# Patient Record
Sex: Male | Born: 1980 | Race: White | Hispanic: No | Marital: Married | Smoking: Former smoker
Health system: Southern US, Community
[De-identification: ages and names within clinical notes are randomized; demographics above are authoritative.]

## PROBLEM LIST (undated history)

## (undated) DIAGNOSIS — J45909 Unspecified asthma, uncomplicated: Secondary | ICD-10-CM

## (undated) DIAGNOSIS — I10 Essential (primary) hypertension: Secondary | ICD-10-CM

## (undated) DIAGNOSIS — K219 Gastro-esophageal reflux disease without esophagitis: Secondary | ICD-10-CM

## (undated) HISTORY — PX: KNEE SURGERY: SHX244

## (undated) HISTORY — PX: TENDON REPAIR: SHX5111

---

## 2013-12-24 ENCOUNTER — Emergency Department (HOSPITAL_COMMUNITY): Payer: Medicaid Other

## 2013-12-24 ENCOUNTER — Emergency Department (HOSPITAL_COMMUNITY)
Admission: EM | Admit: 2013-12-24 | Discharge: 2013-12-24 | Disposition: A | Discharge status: Home / Self Care | Payer: Medicaid Other | Attending: Emergency Medicine | Admitting: Emergency Medicine

## 2013-12-24 ENCOUNTER — Encounter (HOSPITAL_COMMUNITY): Payer: Self-pay | Admitting: Emergency Medicine

## 2013-12-24 DIAGNOSIS — J029 Acute pharyngitis, unspecified: Secondary | ICD-10-CM | POA: Insufficient documentation

## 2013-12-24 DIAGNOSIS — Z880 Allergy status to penicillin: Secondary | ICD-10-CM | POA: Diagnosis not present

## 2013-12-24 DIAGNOSIS — I10 Essential (primary) hypertension: Secondary | ICD-10-CM | POA: Insufficient documentation

## 2013-12-24 HISTORY — DX: Essential (primary) hypertension: I10

## 2013-12-24 LAB — RAPID STREP SCREEN (MED CTR MEBANE ONLY): Streptococcus, Group A Screen (Direct): NEGATIVE

## 2013-12-24 MED ORDER — HYDROCOD POLST-CHLORPHEN POLST 10-8 MG/5ML PO LQCR: 5.0000 mL | Freq: Two times a day (BID) | ORAL | Status: DC | PRN

## 2013-12-24 MED ORDER — HYDROCOD POLST-CHLORPHEN POLST 10-8 MG/5ML PO LQCR
5.0000 mL | Freq: Once | ORAL | Status: AC
  Administered 2013-12-24: 5 mL via ORAL
  Filled 2013-12-24: qty 5

## 2013-12-24 MED ORDER — IBUPROFEN 800 MG PO TABS
800.0000 mg | ORAL_TABLET | Freq: Once | ORAL | Status: AC
  Administered 2013-12-24: 800 mg via ORAL
  Filled 2013-12-24: qty 1

## 2013-12-24 MED ORDER — DEXAMETHASONE 4 MG PO TABS
10.0000 mg | ORAL_TABLET | Freq: Once | ORAL | Status: AC
  Administered 2013-12-24: 10 mg via ORAL
  Filled 2013-12-24: qty 1

## 2013-12-24 MED ORDER — DEXAMETHASONE 6 MG PO TABS
10.0000 mg | ORAL_TABLET | Freq: Once | ORAL | Status: DC
  Filled 2013-12-24: qty 1

## 2013-12-24 NOTE — Discharge Instructions (Signed)
Pharyngitis °Pharyngitis is redness, pain, and swelling (inflammation) of your pharynx.  °CAUSES  °Pharyngitis is usually caused by infection. Most of the time, these infections are from viruses (viral) and are part of a cold. However, sometimes pharyngitis is caused by bacteria (bacterial). Pharyngitis can also be caused by allergies. Viral pharyngitis may be spread from person to person by coughing, sneezing, and personal items or utensils (cups, forks, spoons, toothbrushes). Bacterial pharyngitis may be spread from person to person by more intimate contact, such as kissing.  °SIGNS AND SYMPTOMS  °Symptoms of pharyngitis include:   °· Sore throat.   °· Tiredness (fatigue).   °· Low-grade fever.   °· Headache. °· Joint pain and muscle aches. °· Skin rashes. °· Swollen lymph nodes. °· Plaque-like film on throat or tonsils (often seen with bacterial pharyngitis). °DIAGNOSIS  °Your health care provider will ask you questions about your illness and your symptoms. Your medical history, along with a physical exam, is often all that is needed to diagnose pharyngitis. Sometimes, a rapid strep test is done. Other lab tests may also be done, depending on the suspected cause.  °TREATMENT  °Viral pharyngitis will usually get better in 3-4 days without the use of medicine. Bacterial pharyngitis is treated with medicines that kill germs (antibiotics).  °HOME CARE INSTRUCTIONS  °· Drink enough water and fluids to keep your urine clear or pale yellow.   °· Only take over-the-counter or prescription medicines as directed by your health care provider:   °¨ If you are prescribed antibiotics, make sure you finish them even if you start to feel better.   °¨ Do not take aspirin.   °· Get lots of rest.   °· Gargle with 8 oz of salt water (½ tsp of salt per 1 qt of water) as often as every 1-2 hours to soothe your throat.   °· Throat lozenges (if you are not at risk for choking) or sprays may be used to soothe your throat. °SEEK MEDICAL  CARE IF:  °· You have large, tender lumps in your neck. °· You have a rash. °· You cough up green, yellow-brown, or bloody spit. °SEEK IMMEDIATE MEDICAL CARE IF:  °· Your neck becomes stiff. °· You drool or are unable to swallow liquids. °· You vomit or are unable to keep medicines or liquids down. °· You have severe pain that does not go away with the use of recommended medicines. °· You have trouble breathing (not caused by a stuffy nose). °MAKE SURE YOU:  °· Understand these instructions. °· Will watch your condition. °· Will get help right away if you are not doing well or get worse. °Document Released: 02/25/2005 Document Revised: 12/16/2012 Document Reviewed: 11/02/2012 °ExitCare® Patient Information ©2015 [employer]. This information is not intended to replace advice given to you by your health care provider. Make sure you discuss any questions you have with your health care provider. ° ° °Emergency Department Resource Guide °1) Find a Doctor and Pay Out of Pocket °Although you won't have to find out who is covered by your insurance plan, it is a good idea to ask around and get recommendations. You will then need to call the office and see if the doctor you have chosen will accept you as a new patient and what types of options they offer for patients who are self-pay. Some doctors offer discounts or will set up payment plans for their patients who do not have insurance, but you will need to ask so you aren't surprised when you get to your appointment. ° °  2) Contact Your Local Health Department °Not all health departments have doctors that can see patients for sick visits, but many do, so it is worth a call to see if yours does. If you don't know where your local health department is, you can check in your phone book. The CDC also has a tool to help you locate your state's health department, and many state websites also have listings of all of their local health departments. ° °3) Find a Walk-in Clinic °If  your illness is not likely to be very severe or complicated, you may want to try a walk in clinic. These are popping up all over the country in pharmacies, drugstores, and shopping centers. They're usually staffed by nurse practitioners or physician assistants that have been trained to treat common illnesses and complaints. They're usually fairly quick and inexpensive. However, if you have serious medical issues or chronic medical problems, these are probably not your best option. ° °No Primary Care Doctor: °- Call [facility] at  832-8000 - they can help you locate a primary care doctor that  accepts your insurance, provides certain services, etc. °- Physician Referral Service- 1-800-533-3463 ° °Chronic Pain Problems: °Organization         Address  Phone   Notes  ° [facility]  (336) 297-2271 Patients need to be referred by their primary care doctor.  ° °Medication Assistance: °Organization         Address  Phone   Notes  °[place] [facility] [address] °[place], [place] [address] (336) 641-8030 --Must be a resident of [place] °-- Must have NO insurance coverage whatsoever (no Medicaid/ Medicare, etc.) °-- The pt. MUST have a primary care doctor that directs their care regularly and follows them in the community °  °[facility]  (866) 331-1348   °[employer]  (888) 892-1162   ° °Agencies that provide inexpensive medical care: °Organization         Address  Phone   Notes  °[facility]  (336) 832-8035   °[facility]    (336) 832-7272   °[facility] [address] °[place], [place] [address] (336) 832-4777   °[facility] [address], °[place] (336) 271-4999   °[facility]    (336) 373-0678   °[facility]    (336) 272-1050   °[facility] ° [address], [place] Phone:  (336) 832-4444, Fax:  (336) 832-4440 Hours of  Operation:  9 am - 6 pm, M-F.  Also accepts Medicaid/Medicare and self-pay.  °[facility] ° [address], [place] Phone: (336) 832-3150, Fax: (336) 832-3151. Hours of Operation:  8:30 am - 5:30 pm, M-F.  Also accepts Medicaid and self-pay.  °[facility] [place] [address], [place] Phone: (336) 878-6027   °[facility] [address], [place], [place] (336)723-1848, Ext. 123 Mondays & Thursdays: 7-9 AM.  First 15 patients are seen on a first come, first serve basis. °  ° °Medicaid-accepting [place] Providers: ° °Organization         Address  Phone   Notes  °[facility] [address], [place] (336) 641-2100 Also accepts self-pay patients.  °[facility] [address],  ° (336) 856-9996   °[facility] [address], [place] (336) 288-8857   °[facility] [address] [place] [address], [place] (336) 299-7000   °Veita Bland [address], [place]  ° (336) 373-1557 Only accepts [place] Access Medicaid patients after they have their name applied to their card.  ° °Self-Pay (no insurance) in [place]: ° °Organization         Address  Phone   Notes  °Sickle Cell Patients, [facility] [address], [place] (336) 832-1970   °[facility] [address], [place] (336) 832-4400   °[facility] ° [address], [place] (336) 992-4800   °[facility]/Dr. Osei-Bonsu ° [address] [place] [address], [place] or [address], [place] (336) 841-8500 Phone number for both [place] and [place] locations is the same.  °[facility] [address], [place] (336) 299-0000   °[facility] [address] [place] [address], [place] or [address] (336) 852-7530 °(336) 878-2260   °[facility] [address], [place] (336) 350-1642, phone; (336) 294-5005, fax Sees patients 1st and 3rd Saturday of every month.  Must not qualify for public or private insurance (i.e. Medicaid, Medicare, [place] Health Choice, Veterans' Benefits) • Household income should be no more than 200% of the poverty level •The clinic cannot treat you if you are pregnant or think you are pregnant • Sexually transmitted diseases are not treated at the clinic.  ° ° °Dental Care: °Organization         Address  Phone  Notes  °[facility] [address], [place] (336) 641-6152 Accepts children up to age 21 who are enrolled in Medicaid or [place] Health Choice; pregnant women with a Medicaid card; and children who have applied for Medicaid or [place] Health Choice, but were declined, whose parents can pay a reduced fee at time of service.  °[facility] [place]  [address], [place] (336) 641-7733 Accepts children up to age 21 who are enrolled in Medicaid or [place] Health Choice; pregnant women with a Medicaid card; and children who have applied for Medicaid or [place] Health Choice, but were declined, whose parents can pay a reduced fee at time of service.  °[facility] ° [address], [place] (336) 641-4533 Patients are seen by appointment only. Walk-ins are not accepted. [facility] will see patients 18 years of age and older. °Monday - Tuesday (8am-5pm) °Most Wednesdays (8:30-5pm) °$30 per visit, cash only  °[facility] ° [address], [place] (336) 641-4533 Patients are seen by appointment only. Walk-ins are not accepted. [facility] will see patients 18 years of age and older. °One Wednesday Evening (Monthly: Volunteer Based).  $30 per visit, cash only  °[facility]  (919) 537-3737 for adults; Children under age 4, call Graduate Pediatric  Dentistry at (919) [phone]. Children aged 4-14, please call (919) [phone] to request a pediatric application. ° Dental services are provided in all areas of dental care including fillings, crowns and bridges, complete and partial dentures, implants, gum treatment, root canals, and extractions. Preventive care is also provided. Treatment is provided to both adults and children. °Patients are selected via a lottery and there is often a waiting list. °  °[facility] [address], °  [place] ° (336) 763-8833 [email or web address] °  °[facility] [address], [place], [place] (336)723-1848, Ext. 123 Second and Fourth Thursday of each month, opens at 6:30 AM; Clinic ends at 9 AM.  Patients are seen on a first-come first-served basis, and a limited number are seen during each clinic.  ° °[facility] ° [address], [place], [place] (336) 723-7904   Eligibility Requirements °You must have lived in [place], [place], or [place] counties for at least the last three months. °  You cannot be eligible for state or federal sponsored healthcare insurance, including Veterans Administration, Medicaid, or Medicare. °  You generally cannot be eligible for healthcare insurance through your employer.  °  How to apply: °Eligibility screenings are held every Tuesday and Wednesday afternoon from 1:00 pm until 4:00 pm. You do not need an appointment for the interview!  °[facility] [address], [place], [place] 336-631-2330   °[facility]  336-342-8273   °[facility]  336-703-3100   °[facility]  336-570-6415   ° °Behavioral Health Resources in the Community: °Intensive Outpatient Programs °Organization         Address  Phone  Notes  °[facility] [address], [place], [place] 336-878-6098   °[facility] [address], [place], [place] 336-832-9800   °[facility] [address], [place], [place] ° 336-882-2125   °[facility] [address],  °[place], [place] 1-800-853-5163 or 336-641-4981   °Substance Abuse Resources °Organization         Address  Phone  Notes  °[facility]  336-882-2125   °[facility]  336-784-9470   °[facility]  336-285-9073   °[facility]  336-845-3988   °Residential & Outpatient Substance Abuse Program  1-800-659-3381   °Psychological Services °Organization         Address  Phone  Notes  °[facility]  336- 832-9600   °[facility]  336- 378-7881   °[facility] [address], [place] 1-800-853-5163 or 336-641-4981   ° °Mobile Crisis Teams °Organization         Address  Phone  Notes  °[facility], Mobile Crisis Care Unit  1-877-626-1772   °Assertive °[facility] ° [address] [place], [place] 336-834-9664   °Sharon DeEsch [address] °[place] 336-554-5454   ° °Self-Help/Support Groups °Organization         Address  Phone             Notes  °[facility] - variety of support groups  336- 373-1402 Call for more information  °Narcotics Anonymous (NA), [facility] [address], °[place]  2 meetings at this location  ° °Residential Treatment Programs °Organization         Address  Phone  Notes  °[facility] [address],    °[place]  1-866-801-8205   °[facility] ° [address], [place], [place] 704-293-8524   °[facility] [address], [place] 336-845-3988 Admissions: 8am-3pm M-F  °[facility] [address]    °[place], [place] 336-841-1104   °[facility] [address], [place],  336-379-7146   °[facility] [address]  °[place],  336-285-9073   °[facility] - Intensive Outpatient [address],  [place], [place] 336-852-3033   °[facility] [address]  °[place], [place] 1-877-615-2722 or 336-784-9470   °[facility] [address] [place], [place] 336-227-7417 Accepts Medicaid  °[facility] [address]  °[place] 1-800-659-3381 Substance Abuse/Addiction Treatment  ° °[place] Behavioral Health Resources °Organization         Address  Phone  Notes  °[facility]  (888) 581-9988   °Julie Brannon, PhD [address] [place], [place]   (336) 349-5553 or (336) 951-0000   °[facility]   [address] °[place], [place] (336) 349-4454   °[facility] [address], [place], [place] (336) 342-8316 Insurance/Medicaid/sponsorship through [facility]  °[facility] [address]                                    [place], [place] (336) 342-8316 Therapy/tele-psych/case  °[facility] [address]  ° [place], [place] (336) 349-2233    °Dr. Arfeen  (336) 349-4544   °[facility] [place]  [employer] [place] [facility] 1) [address], [place] °2) [address], [place] °3)  [address], [place] (336) 349-3220 °(336) 342-7768 ° °(336) 342-8140   °[place] [facility] (336) 342-1394 or (336) 342-3537 (After Hours)    ° ° ° ° °

## 2013-12-24 NOTE — ED Notes (Signed)
Gwendolyn GrantWalden MD at bedside.

## 2013-12-24 NOTE — ED Provider Notes (Signed)
CSN: [id number]     Arrival date & time 12/24/13  [id number] History   First MD Initiated Contact with Patient 12/24/13 [id number]     Chief Complaint  Patient presents with  . Cough  . Sore Throat     (Consider location/radiation/quality/duration/timing/severity/associated sxs/prior Treatment) Patient is a 33 y.o. male presenting with cough and pharyngitis. The history is provided by the patient.  Cough Cough characteristics:  Productive Sputum characteristics:  Yellow Severity:  Moderate Onset quality:  Gradual Duration:  1 week Timing:  Constant Progression:  Unchanged Chronicity:  New Smoker: no   Context: sick contacts   Relieved by:  Nothing Worsened by:  Nothing tried Associated symptoms: chills, myalgias, rhinorrhea, shortness of breath and sore throat   Associated symptoms: no chest pain and no fever   Sore Throat Associated symptoms include shortness of breath. Pertinent negatives include no chest pain.    Past Medical History  Diagnosis Date  . Hypertension    History reviewed. No pertinent past surgical history. No family history on file. History  Substance Use Topics  . Smoking status: Former Smoker    Quit date: 03/27/1999  . Smokeless tobacco: Not on file  . Alcohol Use: No    Review of Systems  Constitutional: Positive for chills. Negative for fever.  HENT: Positive for rhinorrhea and sore throat.   Respiratory: Positive for cough and shortness of breath.   Cardiovascular: Negative for chest pain and leg swelling.  Musculoskeletal: Positive for myalgias.      Allergies  Penicillins  Home Medications   Prior to Admission medications   Not on File   BP 101/79  Pulse 92  Temp(Src) 98.2 F (36.8 C) (Oral)  Resp 20  SpO2 99% Physical Exam  Constitutional: He is oriented to person, place, and time. He appears well-developed and well-nourished. No distress.  HENT:  Head: Normocephalic and atraumatic.  Mouth/Throat: Posterior oropharyngeal  erythema (bilateral tonsils) present. No oropharyngeal exudate, posterior oropharyngeal edema or tonsillar abscesses.  Eyes: EOM are normal. Pupils are equal, round, and reactive to light.  Neck: Normal range of motion. Neck supple.  Cardiovascular: Normal rate and regular rhythm.  Exam reveals no friction rub.   No murmur heard. Pulmonary/Chest: Effort normal and breath sounds normal. No respiratory distress. He has no wheezes. He has no rales.  Abdominal: He exhibits no distension. There is no tenderness. There is no rebound.  Musculoskeletal: Normal range of motion. He exhibits no edema.  Neurological: He is alert and oriented to person, place, and time.  Skin: He is not diaphoretic.    ED Course  Procedures (including critical care time) Labs Review Labs Reviewed - No data to display  Imaging Review Dg Chest 2 View  12/24/2013   CLINICAL DATA:  Fever, cough, congestion, and shortness of breath for 1 week.  EXAM: CHEST  2 VIEW  COMPARISON:  None.  FINDINGS: The cardiomediastinal silhouette is within normal limits. The lungs are well inflated and clear. There is no evidence of pleural effusion or pneumothorax. No acute osseous abnormality is identified.  IMPRESSION: No active cardiopulmonary disease.   Electronically Signed   By: Sebastian AcheAllen  Grady   On: 12/24/2013 08:06     EKG Interpretation None      MDM   Final diagnoses:  Pharyngitis    80M with URI symptoms. Sore throat, subjective fever/chills, cough, runny nose. Mild productivity to his cough. No SOB. No N/V. No abdominal pain. On exam, posterior pharyngeal erythema without swelling/uvular shift. Will  give decadron for symptomatic treatment. Will xray and swab for strep. Xray negative. Strep negative.  Patient stable for discharge, given cough medicine.  I have reviewed all labs and imaging and considered them in my medical decision making.   Elwin MochaBlair Van Seymore, MD 12/24/13 [phone]

## 2013-12-24 NOTE — ED Notes (Addendum)
Per pt reports productive cough and runny nose for a week. Pt report sore throat with cough and swallow. Pt reports "I know how I got it. I was in a junk yard in a rain storm." Pt denies SOB.

## 2013-12-26 LAB — CULTURE, GROUP A STREP

## 2015-07-19 ENCOUNTER — Emergency Department: Payer: Medicaid Other

## 2015-07-19 ENCOUNTER — Emergency Department
Admission: EM | Admit: 2015-07-19 | Discharge: 2015-07-19 | Disposition: A | Discharge status: Home / Self Care | Payer: Medicaid Other | Attending: Emergency Medicine | Admitting: Emergency Medicine

## 2015-07-19 ENCOUNTER — Encounter: Payer: Self-pay | Admitting: Emergency Medicine

## 2015-07-19 DIAGNOSIS — S60552A Superficial foreign body of left hand, initial encounter: Secondary | ICD-10-CM

## 2015-07-19 DIAGNOSIS — I10 Essential (primary) hypertension: Secondary | ICD-10-CM | POA: Insufficient documentation

## 2015-07-19 DIAGNOSIS — Z87891 Personal history of nicotine dependence: Secondary | ICD-10-CM | POA: Insufficient documentation

## 2015-07-19 DIAGNOSIS — Y999 Unspecified external cause status: Secondary | ICD-10-CM | POA: Diagnosis not present

## 2015-07-19 DIAGNOSIS — Y9389 Activity, other specified: Secondary | ICD-10-CM | POA: Insufficient documentation

## 2015-07-19 DIAGNOSIS — W208XXA Other cause of strike by thrown, projected or falling object, initial encounter: Secondary | ICD-10-CM | POA: Diagnosis not present

## 2015-07-19 DIAGNOSIS — Y9289 Other specified places as the place of occurrence of the external cause: Secondary | ICD-10-CM | POA: Diagnosis not present

## 2015-07-19 DIAGNOSIS — S51822A Laceration with foreign body of left forearm, initial encounter: Secondary | ICD-10-CM | POA: Insufficient documentation

## 2015-07-19 DIAGNOSIS — S51812A Laceration without foreign body of left forearm, initial encounter: Secondary | ICD-10-CM

## 2015-07-19 MED ORDER — LIDOCAINE-EPINEPHRINE (PF) 1 %-1:200000 IJ SOLN
10.0000 mL | Freq: Once | INTRAMUSCULAR | Status: AC
  Administered 2015-07-19: 10 mL
  Filled 2015-07-19: qty 30

## 2015-07-19 NOTE — ED Notes (Signed)
Pt reports that he was taking down a shed and something fell on him causing a laceration/puncture wound to his left arm - pt is also c/o pain left hand/wrist especially with movement of fingers and wrist

## 2015-07-19 NOTE — ED Provider Notes (Signed)
[facility] Emergency Department Provider Note  ____________________________________________  Time seen: Approximately 9:20 PM  I have reviewed the triage vital signs and the nursing notes.   HISTORY  Chief Complaint Laceration    HPI Clifford Mccarthy is a 35 y.o. male presenting with a laceration on the lateral aspect of the left forearm. Patient states he was working in a shed at approximately 2000 this evening when an object fell striking his arm. He is unaware what the object was. He admits to pain with movement of the wrist or fingers and numbness in his thumb. He denies numbness or tingling to any of the other digits. He feels as though there may be a foreign body in the laceration. Patient states he is unaware of his most recent tetanus vaccination.    Past Medical History  Diagnosis Date  . Hypertension     There are no active problems to display for this patient.   History reviewed. No pertinent past surgical history.  Current Outpatient Rx  Name  Route  Sig  Dispense  Refill  . acetaminophen (TYLENOL) 325 MG tablet   Oral   Take 650 mg by mouth every 4 (four) hours as needed for moderate pain.         . chlorpheniramine-HYDROcodone (TUSSIONEX) 10-8 MG/5ML LQCR   Oral   Take 5 mLs by mouth every 12 (twelve) hours as needed for cough.   115 mL   0     Allergies Penicillins  No family history on file.  Social History Social History  Substance Use Topics  . Smoking status: Former Smoker    Quit date: 03/27/1999  . Smokeless tobacco: None  . Alcohol Use: No     Review of Systems  Eyes: No visual changes.  Musculoskeletal: Negative for musculoskeletal pain. Pain  Skin: Negative for rash, abrasions, lacerations, ecchymosis. Neurological: Negative for headaches, focal weakness or numbness. 10-point ROS otherwise negative.  ____________________________________________   PHYSICAL EXAM:  VITAL SIGNS: ED Triage Vitals  Enc  Vitals Group     BP 07/19/15 1951 120/73 mmHg     Pulse Rate 07/19/15 1951 73     Resp 07/19/15 1951 20     Temp 07/19/15 1951 97.9 F (36.6 C)     Temp Source 07/19/15 1951 Oral     SpO2 07/19/15 1951 97 %     Weight 07/19/15 1951 230 lb (104.327 kg)     Height 07/19/15 1951 5\' 9"  (1.753 m)     Head Cir --      Peak Flow --      Pain Score 07/19/15 1951 10     Pain Loc --      Pain Edu? --      Excl. in GC? --      Constitutional: Alert and oriented. Well appearing and in no acute distress. Eyes: Conjunctivae are normal. PERRL. EOMI. Head: Atraumatic. Neck: No stridor.  Cardiovascular:  Good peripheral circulation. Respiratory: Normal respiratory effort without tachypnea or retractions. Musculoskeletal: Full range of motion to all extremities. No gross deformities appreciated. Full ROM in the left wrist with pain some pain. Tenderness to palpation over left wrist.  Neurologic:  Normal speech and language. No gross focal neurologic deficits are appreciated.  Skin:  Skin is warm, dry and intact. No rash noted. Laceration of the left forearm.  It appears fibrous/metallic in nature. No bleeding at this time. Edges are smooth in nature. Pulses and sensation are intact distally. Psychiatric: Mood and  affect are normal. Speech and behavior are normal. Patient exhibits appropriate insight and judgement.   ____________________________________________   LABS (all labs ordered are listed, but only abnormal results are displayed)  Labs Reviewed - No data to display   RADIOLOGY I, Delorise Royals Tashanda Fuhrer, personally viewed and evaluated these images (plain radiographs) as part of my medical decision making, as well as reviewing the written report by the radiologist   DG Hand Complete Left (Final result) Result time: 07/19/15 21:55:03   Final result by Rad Results In Interface (07/19/15 21:55:03)   Narrative:   CLINICAL DATA: 35 year old male with trauma to the left hand. Evaluate  for foreign object.  EXAM: LEFT HAND - COMPLETE 3+ VIEW  COMPARISON: Radiograph dated 07/19/2015  FINDINGS: There is a 5.5 cm linear foreign object in the soft tissues of the dorsum of the wrist extending from the distal radius to the proximal aspect of the third metacarpal similar to prior study. This is concerning for a foreign object. Clinical correlation is recommended. There is mild soft tissue swelling of the dorsal aspect of the distal forearm and wrist. There is no acute fracture or dislocation. The bones are well mineralized. No arthropathy.  IMPRESSION: Linear radiopaque structure in the dorsal aspect of the wrist as described similar to prior study may represent a foreign object. Clinical correlation is recommended.   Electronically Signed By: Elgie Collard M.D. On: 07/19/2015 21:55          DG Hand Complete Left (Final result) Result time: 07/19/15 20:56:06   Final result by Rad Results In Interface (07/19/15 20:56:06)   Narrative:   CLINICAL DATA: reports taking down shed and was hit with unknown object; approx 1" lac noted to hand with no active bleeding. Pain across metacarpals. Initially counter  EXAM: LEFT HAND - COMPLETE 3+ VIEW  COMPARISON: None.  FINDINGS: No evidence of fracture dislocation.  There is a linear 4 cm foreign body projecting over the radial aspect of the carpal roe. This may represent a foreign body within the soft tissues. This object is only minimally radiodense along the edges.  IMPRESSION: Potential long narrow foreign body with within the soft tissues dorsal carpal row       PROCEDURES  Procedure(s) performed:   LACERATION REPAIR Performed by: Racheal Patches Authorized by: Delorise Royals Kristl Morioka Consent: Verbal consent obtained. Risks and benefits: risks, benefits and alternatives were discussed Consent given by: patient Patient identity confirmed: provided demographic data Prepped and  Draped in normal sterile fashion Wound explored  Laceration Location: left forearm.   Laceration Length: 2cm  Foreign body is appreciated visually in the wound. This is extracted. This appears to be a fibrous bristle. It measures approximately 2.5 cm in length.  Anesthesia: local infiltration  Local anesthetic: lidocaine 1% w/ epinephrine  Anesthetic total: 7 ml  Irrigation method: syringe Amount of cleaning: standard  Skin closure: 4-0 Ethilon sutures   Number of sutures: 4   Technique: Simple interrupted   Patient tolerance: Patient tolerated the procedure well with no immediate complications.    Medications  lidocaine-EPINEPHrine (XYLOCAINE-EPINEPHrine) 1 %-1:200000 (PF) injection 10 mL (10 mLs Infiltration Given 07/19/15 2247)     ____________________________________________   INITIAL IMPRESSION / ASSESSMENT AND PLAN / ED COURSE  Pertinent labs & imaging results that were available during my care of the patient were reviewed by me and considered in my medical decision making (see chart for details).  Patient's diagnosis is consistent with Laceration to left forearm. There is a  foreign body visualized on x-ray. No acute osseous abnormality. Examination of wound reveals foreign body that is removed. However, there is a visualize foreign body is distal to the incision site. This is nonpalpable on exam. Due to the nature of Underlying vasculature as well as tendons and ligaments in this region and without being able to appreciate foreign body by palpation, I will not attempt to extract this. As such, laceration is closed as described above. Patient's hand is splinted to prevent movement and he will be referred to orthopedics for removal of visualize foreign body..  Patient is given ED precautions to return to the ED for any worsening or new symptoms.     ____________________________________________  FINAL CLINICAL IMPRESSION(S) / ED DIAGNOSES  Final diagnoses:   Forearm laceration, left, initial encounter  Foreign body in hand, left, initial encounter      NEW MEDICATIONS STARTED DURING THIS VISIT:  Discharge Medication List as of 07/19/2015 10:02 PM          This chart was dictated using voice recognition software/Dragon. Despite best efforts to proofread, errors can occur which can change the meaning. Any change was purely unintentional.   Racheal Patches, PA-C 07/20/15 2322  Myrna Blazer, MD 07/22/15 [phone]

## 2015-07-19 NOTE — Discharge Instructions (Signed)

## 2015-07-19 NOTE — ED Notes (Signed)
Pt to triage via w/c with no distress noted; reports taking down shed PTA and was hit with unknown object; approx 1" lac noted to left FA with no active bleeding; 4x4 gauze applied to site

## 2015-07-26 ENCOUNTER — Encounter: Payer: Self-pay | Admitting: *Deleted

## 2015-07-26 ENCOUNTER — Other Ambulatory Visit: Payer: Medicaid Other

## 2015-07-26 NOTE — Pre-Procedure Instructions (Signed)
PT WAS ASKED IF HE COULD COME IN TODAY FOR EKG DUE TO HTN AND HE SAID NO.  I TOLD HIM WE WILL DO IT THE AM OF SURGERY BUT IF ANYTHING IS ABNORMAL THEN HIS SURGERY COULD BE CANCELLED. I TOLD HIM THAT IS WHY WE LIKE TO DO IT BEFORE HIS SURGERY.  PT VERBALIZED UNDERSTANDING BUT KEPT SAYING THAT HIS BP IS FINE

## 2015-07-26 NOTE — Patient Instructions (Signed)
  Your procedure is scheduled on: 07-27-15 Report to medical mall same day surgery 2nd floor To find out your arrival time please call [phone] between 1PM - 3PM on 07-26-15  Remember: Instructions that are not followed completely may result in serious medical risk, up to and including death, or upon the discretion of your surgeon and anesthesiologist your surgery may need to be rescheduled.    _X___ 1. Do not eat food or drink liquids after midnight. No gum chewing or hard candies.     _X___ 2. No Alcohol for 24 hours before or after surgery.   ____ 3. Bring all medications with you on the day of surgery if instructed.    ____ 4. Notify your doctor if there is any change in your medical condition     (cold, fever, infections).     Do not wear jewelry, make-up, hairpins, clips or nail polish.  Do not wear lotions, powders, or perfumes. You may wear deodorant.  Do not shave 48 hours prior to surgery. Men may shave face and neck.  Do not bring valuables to the hospital.    [facility] is not responsible for any belongings or valuables.               Contacts, dentures or bridgework may not be worn into surgery.  Leave your suitcase in the car. After surgery it may be brought to your room.  For patients admitted to the hospital, discharge time is determined by your treatment team.   Patients discharged the day of surgery will not be allowed to drive home.   Please read over the following fact sheets that you were given:      ____ Take these medicines the morning of surgery with A SIP OF WATER:    1.NONE  2.   3.   4.  5.  6.  ____ Fleet Enema (as directed)   _X___ Use CHG Soap as directed  ____ Use inhalers on the day of surgery  ____ Stop metformin 2 days prior to surgery    ____ Take 1/2 of usual insulin dose the night before surgery and none on the morning of surgery.   ____ Stop Coumadin/Plavix/aspirin-N/A  _X___ Stop Anti-inflammatories-STOP IBUPROFEN NOW-NO  NSAIDS OR ASA PRODUCTS-TYLENOL/TRAMADOL OK TO TAKE   ____ Stop supplements until after surgery.    ____ Bring C-Pap to the hospital.

## 2015-07-27 ENCOUNTER — Encounter
Admission: RE | Disposition: A | Discharge status: Home / Self Care | Payer: Self-pay | Source: Ambulatory Visit | Attending: Surgery

## 2015-07-27 ENCOUNTER — Ambulatory Visit: Payer: Medicaid Other | Admitting: Anesthesiology

## 2015-07-27 ENCOUNTER — Encounter: Payer: Self-pay | Admitting: *Deleted

## 2015-07-27 ENCOUNTER — Ambulatory Visit
Admission: RE | Admit: 2015-07-27 | Discharge: 2015-07-27 | Disposition: A | Discharge status: Home / Self Care | Payer: Medicaid Other | Source: Ambulatory Visit | Attending: Surgery | Admitting: Surgery

## 2015-07-27 DIAGNOSIS — S61542A Puncture wound with foreign body of left wrist, initial encounter: Secondary | ICD-10-CM | POA: Insufficient documentation

## 2015-07-27 DIAGNOSIS — Z833 Family history of diabetes mellitus: Secondary | ICD-10-CM | POA: Insufficient documentation

## 2015-07-27 DIAGNOSIS — J45909 Unspecified asthma, uncomplicated: Secondary | ICD-10-CM | POA: Diagnosis not present

## 2015-07-27 DIAGNOSIS — Z809 Family history of malignant neoplasm, unspecified: Secondary | ICD-10-CM | POA: Insufficient documentation

## 2015-07-27 DIAGNOSIS — I10 Essential (primary) hypertension: Secondary | ICD-10-CM | POA: Diagnosis not present

## 2015-07-27 DIAGNOSIS — Z8249 Family history of ischemic heart disease and other diseases of the circulatory system: Secondary | ICD-10-CM | POA: Diagnosis not present

## 2015-07-27 DIAGNOSIS — Z820 Family history of epilepsy and other diseases of the nervous system: Secondary | ICD-10-CM | POA: Diagnosis not present

## 2015-07-27 DIAGNOSIS — Y9289 Other specified places as the place of occurrence of the external cause: Secondary | ICD-10-CM | POA: Insufficient documentation

## 2015-07-27 DIAGNOSIS — I4510 Unspecified right bundle-branch block: Secondary | ICD-10-CM | POA: Insufficient documentation

## 2015-07-27 DIAGNOSIS — Y9389 Activity, other specified: Secondary | ICD-10-CM | POA: Insufficient documentation

## 2015-07-27 DIAGNOSIS — W208XXA Other cause of strike by thrown, projected or falling object, initial encounter: Secondary | ICD-10-CM | POA: Diagnosis not present

## 2015-07-27 DIAGNOSIS — Z79899 Other long term (current) drug therapy: Secondary | ICD-10-CM | POA: Diagnosis not present

## 2015-07-27 DIAGNOSIS — Z7951 Long term (current) use of inhaled steroids: Secondary | ICD-10-CM | POA: Diagnosis not present

## 2015-07-27 HISTORY — DX: Unspecified asthma, uncomplicated: J45.909

## 2015-07-27 HISTORY — PX: FOREIGN BODY REMOVAL: SHX962

## 2015-07-27 HISTORY — DX: Gastro-esophageal reflux disease without esophagitis: K21.9

## 2015-07-27 SURGERY — REMOVAL FOREIGN BODY EXTREMITY
Anesthesia: General | Site: Hand | Laterality: Left | Wound class: Clean

## 2015-07-27 MED ORDER — CEFAZOLIN SODIUM 1 G IJ SOLR
INTRAMUSCULAR | Status: DC | PRN
  Administered 2015-07-27: 1975 mg via INTRAMUSCULAR
  Administered 2015-07-27: 25 mg via INTRAMUSCULAR

## 2015-07-27 MED ORDER — FENTANYL CITRATE (PF) 100 MCG/2ML IJ SOLN
INTRAMUSCULAR | Status: AC
  Filled 2015-07-27: qty 2

## 2015-07-27 MED ORDER — LIDOCAINE HCL (PF) 1 % IJ SOLN
INTRAMUSCULAR | Status: AC
  Filled 2015-07-27: qty 30

## 2015-07-27 MED ORDER — ONDANSETRON HCL 4 MG/2ML IJ SOLN
INTRAMUSCULAR | Status: AC
  Filled 2015-07-27: qty 2

## 2015-07-27 MED ORDER — ONDANSETRON HCL 4 MG PO TABS: 4.0000 mg | ORAL_TABLET | Freq: Four times a day (QID) | ORAL | Status: DC | PRN

## 2015-07-27 MED ORDER — METOCLOPRAMIDE HCL 10 MG PO TABS: 5.0000 mg | ORAL_TABLET | Freq: Three times a day (TID) | ORAL | Status: DC | PRN

## 2015-07-27 MED ORDER — FAMOTIDINE 20 MG PO TABS
ORAL_TABLET | ORAL | Status: AC
  Administered 2015-07-27: 20 mg via ORAL
  Filled 2015-07-27: qty 1

## 2015-07-27 MED ORDER — ROCURONIUM BROMIDE 100 MG/10ML IV SOLN
INTRAVENOUS | Status: DC | PRN
  Administered 2015-07-27: 20 mg via INTRAVENOUS

## 2015-07-27 MED ORDER — METOCLOPRAMIDE HCL 5 MG/ML IJ SOLN: 5.0000 mg | Freq: Three times a day (TID) | INTRAMUSCULAR | Status: DC | PRN

## 2015-07-27 MED ORDER — SULFAMETHOXAZOLE-TRIMETHOPRIM 400-80 MG PO TABS: 1.0000 | ORAL_TABLET | Freq: Two times a day (BID) | ORAL | Status: AC

## 2015-07-27 MED ORDER — FENTANYL CITRATE (PF) 100 MCG/2ML IJ SOLN
INTRAMUSCULAR | Status: DC | PRN
  Administered 2015-07-27: 200 ug via INTRAVENOUS
  Administered 2015-07-27: 100 ug via INTRAVENOUS

## 2015-07-27 MED ORDER — BUPIVACAINE HCL (PF) 0.5 % IJ SOLN
INTRAMUSCULAR | Status: AC
  Filled 2015-07-27: qty 30

## 2015-07-27 MED ORDER — FENTANYL CITRATE (PF) 100 MCG/2ML IJ SOLN
25.0000 ug | INTRAMUSCULAR | Status: DC | PRN
Start: 2015-07-27 — End: 2015-07-27
  Administered 2015-07-27 (×4): 25 ug via INTRAVENOUS

## 2015-07-27 MED ORDER — ONDANSETRON HCL 4 MG/2ML IJ SOLN
4.0000 mg | Freq: Four times a day (QID) | INTRAMUSCULAR | Status: DC | PRN
  Administered 2015-07-27: 4 mg via INTRAVENOUS

## 2015-07-27 MED ORDER — OXYCODONE HCL 5 MG PO TABS
ORAL_TABLET | ORAL | Status: AC
  Filled 2015-07-27: qty 1

## 2015-07-27 MED ORDER — SUCCINYLCHOLINE CHLORIDE 20 MG/ML IJ SOLN
INTRAMUSCULAR | Status: DC | PRN
  Administered 2015-07-27: 100 mg via INTRAVENOUS

## 2015-07-27 MED ORDER — GLYCOPYRROLATE 0.2 MG/ML IJ SOLN
INTRAMUSCULAR | Status: DC | PRN
  Administered 2015-07-27: 0.4 mg via INTRAVENOUS

## 2015-07-27 MED ORDER — LIDOCAINE HCL (CARDIAC) 20 MG/ML IV SOLN
INTRAVENOUS | Status: DC | PRN
  Administered 2015-07-27 (×2): 100 mg via INTRAVENOUS

## 2015-07-27 MED ORDER — BUPIVACAINE HCL (PF) 0.5 % IJ SOLN
INTRAMUSCULAR | Status: DC | PRN
  Administered 2015-07-27: 10 mL

## 2015-07-27 MED ORDER — KETOROLAC TROMETHAMINE 30 MG/ML IJ SOLN
INTRAMUSCULAR | Status: DC | PRN
  Administered 2015-07-27: 30 mg via INTRAVENOUS

## 2015-07-27 MED ORDER — NEOMYCIN-POLYMYXIN B GU 40-200000 IR SOLN
Status: DC | PRN
  Administered 2015-07-27: 2 mL

## 2015-07-27 MED ORDER — NEOMYCIN-POLYMYXIN B GU 40-200000 IR SOLN
Status: AC
  Filled 2015-07-27: qty 2

## 2015-07-27 MED ORDER — POTASSIUM CHLORIDE IN NACL 20-0.9 MEQ/L-% IV SOLN
INTRAVENOUS | Status: DC
  Filled 2015-07-27 (×5): qty 1000

## 2015-07-27 MED ORDER — ONDANSETRON HCL 4 MG/2ML IJ SOLN
4.0000 mg | Freq: Once | INTRAMUSCULAR | Status: AC | PRN
  Administered 2015-07-27: 4 mg via INTRAVENOUS

## 2015-07-27 MED ORDER — FAMOTIDINE 20 MG PO TABS
20.0000 mg | ORAL_TABLET | Freq: Once | ORAL | Status: AC
  Administered 2015-07-27: 20 mg via ORAL

## 2015-07-27 MED ORDER — ONDANSETRON HCL 4 MG/2ML IJ SOLN
INTRAMUSCULAR | Status: AC
  Administered 2015-07-27: 4 mg via INTRAVENOUS
  Filled 2015-07-27: qty 2

## 2015-07-27 MED ORDER — LACTATED RINGERS IV SOLN
INTRAVENOUS | Status: DC
  Administered 2015-07-27: 13:00:00 via INTRAVENOUS

## 2015-07-27 MED ORDER — NEOSTIGMINE METHYLSULFATE 10 MG/10ML IV SOLN
INTRAVENOUS | Status: DC | PRN
  Administered 2015-07-27: 3 mg via INTRAVENOUS

## 2015-07-27 MED ORDER — ONDANSETRON HCL 4 MG/2ML IJ SOLN
INTRAMUSCULAR | Status: DC | PRN
  Administered 2015-07-27: 4 mg via INTRAVENOUS

## 2015-07-27 MED ORDER — MIDAZOLAM HCL 2 MG/2ML IJ SOLN
INTRAMUSCULAR | Status: DC | PRN
  Administered 2015-07-27: 1.5 mg via INTRAVENOUS

## 2015-07-27 MED ORDER — OXYCODONE HCL 5 MG PO TABS
5.0000 mg | ORAL_TABLET | ORAL | Status: DC | PRN
  Administered 2015-07-27: 5 mg via ORAL

## 2015-07-27 SURGICAL SUPPLY — 37 items
BANDAGE ACE 3X5.8 VEL STRL LF (GAUZE/BANDAGES/DRESSINGS) ×3 IMPLANT
BENZOIN TINCTURE PRP APPL 2/3 (GAUZE/BANDAGES/DRESSINGS) ×3 IMPLANT
BNDG ESMARK 4X12 TAN STRL LF (GAUZE/BANDAGES/DRESSINGS) ×3 IMPLANT
CANISTER SUCT 1200ML W/VALVE (MISCELLANEOUS) ×3 IMPLANT
CHLORAPREP W/TINT 26ML (MISCELLANEOUS) ×3 IMPLANT
CLOSURE WOUND 1/4X4 (GAUZE/BANDAGES/DRESSINGS) ×1
CORD BIP STRL DISP 12FT (MISCELLANEOUS) ×3 IMPLANT
CUFF TOURN 18 STER (MISCELLANEOUS) IMPLANT
DRAPE SURG 17X11 SM STRL (DRAPES) ×6 IMPLANT
FORCEPS JEWEL BIP 4-3/4 STR (INSTRUMENTS) ×3 IMPLANT
GAUZE PETRO XEROFOAM 1X8 (MISCELLANEOUS) IMPLANT
GAUZE SPONGE 4X4 12PLY STRL (GAUZE/BANDAGES/DRESSINGS) IMPLANT
GLOVE BIO SURGEON STRL SZ8 (GLOVE) ×6 IMPLANT
GLOVE EXAM NITRILE PF MED BLUE (GLOVE) ×3 IMPLANT
GLOVE INDICATOR 8.0 STRL GRN (GLOVE) ×3 IMPLANT
GOWN STRL REUS W/ TWL LRG LVL3 (GOWN DISPOSABLE) ×1 IMPLANT
GOWN STRL REUS W/ TWL XL LVL3 (GOWN DISPOSABLE) ×1 IMPLANT
GOWN STRL REUS W/TWL LRG LVL3 (GOWN DISPOSABLE) ×2
GOWN STRL REUS W/TWL XL LVL3 (GOWN DISPOSABLE) ×2
KIT RM TURNOVER STRD PROC AR (KITS) ×3 IMPLANT
NDL SAFETY 25GX1.5 (NEEDLE) ×3 IMPLANT
NS IRRIG 500ML POUR BTL (IV SOLUTION) ×3 IMPLANT
PACK EXTREMITY ARMC (MISCELLANEOUS) ×3 IMPLANT
PAD CAST CTTN 4X4 STRL (SOFTGOODS) IMPLANT
PADDING CAST 3IN STRL (MISCELLANEOUS) ×2
PADDING CAST BLEND 3X4 STRL (MISCELLANEOUS) ×1 IMPLANT
PADDING CAST COTTON 4X4 STRL (SOFTGOODS)
SPLINT CAST 1 STEP 3X12 (MISCELLANEOUS) ×3 IMPLANT
SPLINT WRIST LG LT TX990309 (SOFTGOODS) IMPLANT
SPLINT WRIST M LT TX990308 (SOFTGOODS) IMPLANT
SPLINT WRIST M RT TX990303 (SOFTGOODS) IMPLANT
STOCKINETTE STRL 4IN 9604848 (GAUZE/BANDAGES/DRESSINGS) IMPLANT
STRIP CLOSURE SKIN 1/4X4 (GAUZE/BANDAGES/DRESSINGS) ×2 IMPLANT
SUT PROLENE 4 0 PS 2 18 (SUTURE) ×3 IMPLANT
SUT VIC AB 2-0 SH 27 (SUTURE) ×2
SUT VIC AB 2-0 SH 27XBRD (SUTURE) ×1 IMPLANT
SWABSTK COMLB BENZOIN TINCTURE (MISCELLANEOUS) ×3 IMPLANT

## 2015-07-27 NOTE — Transfer of Care (Signed)
Immediate Anesthesia Transfer of Care Note  Patient: Clifford Mccarthy  Procedure(s) Performed: Procedure(s): REMOVAL FOREIGN BODY EXTREMITY (Left)  Patient Location: PACU  Anesthesia Type:General  Level of Consciousness: awake, alert , oriented and patient cooperative  Airway & Oxygen Therapy: Patient Spontanous Breathing and Patient connected to face mask oxygen  Post-op Assessment: Report given to RN and Post -op Vital signs reviewed and stable  Post vital signs: Reviewed and stable  Last Vitals:  Filed Vitals:   07/27/15 1226  BP: 144/97  Pulse: 83  Temp: 37.1 C  Resp: 16    Last Pain:  Filed Vitals:   07/27/15 1227  PainSc: 6          Complications: No apparent anesthesia complications

## 2015-07-27 NOTE — Discharge Instructions (Addendum)
Keep dressing/splint dry and intact. Keep hand elevated above heart level. May shower after dressing/splint removed on postop day 4 (Monday). Cover sutures with Band-Aids after drying off. May reapply plaster or Velcro splint after showering as needed for comfort. Apply ice to affected area frequently. Return for follow-up in 10-14 days or as scheduled.  AMBULATORY SURGERY  DISCHARGE INSTRUCTIONS   1) The drugs that you were given will stay in your system until tomorrow so for the next 24 hours you should not:  A) Drive an automobile B) Make any legal decisions C) Drink any alcoholic beverage   2) You may resume regular meals tomorrow.  Today it is better to start with liquids and gradually work up to solid foods.  You may eat anything you prefer, but it is better to start with liquids, then soup and crackers, and gradually work up to solid foods.   3) Please notify your doctor immediately if you have any unusual bleeding, trouble breathing, redness and pain at the surgery site, drainage, fever, or pain not relieved by medication.    4) Additional Instructions:        Please contact your physician with any problems or Same Day Surgery at [phone], Monday through Friday 6 am to 4 pm, or [place] at [facility] Main number at [phone].

## 2015-07-27 NOTE — Op Note (Signed)
07/27/2015  4:04 PM  Patient:   Clifford Mccarthy  Pre-Op Diagnosis:   Status post acute foreign body, left wrist.  Post-Op Diagnosis:   Same.  Procedure:   Exploration, debridement, and removal of foreign body, left wrist.  Surgeon:   Maryagnes AmosJ. Jeffrey Poggi, MD  Assistant:   None  Anesthesia:   GET  Findings:   As above.  Complications:   None  EBL:   5 cc  Fluids:   800 cc crystalloid  TT:   67 minutes at 250 mmHg  Drains:   None  Closure:   4-0 Prolene interrupted sutures, 2-0 Vicryl subcuticular sutures.  Brief Clinical Note:   The patient is a 35 year old male who sustained an injury to his left wrist 1 week ago. Apparently he was in his garage when something fell down from a shelf above and stabbed his wrist. He came to the emergency room where a portion of it was removed and the wound closed. He was started on po antibiotics and advised to follow-up with orthopedics as there was still evidence of a retained foreign body in the dorsal aspect of the left wrist. He presents at this time for formal irrigation and debridement with exploration and removal of the foreign body.  Procedure:   The patient was brought into the operating room and lain in the supine position. After adequate general endotracheal intubation and anesthesia were obtained, the patient's left upper extremity and hand were prepped with ChloraPrep solution before being draped sterilely. Preoperative antibiotics were administered. A timeout was performed to verify the appropriate surgical site before the limb was exsanguinated with an Esmarch and the tourniquet inflated to 250 mmHg. The location of the foreign body was identified using the FluoroScan unit and an approximately 4 cm incision made over the length of the foreign body. The incision was carried down through the subcutaneous tissues with care taken to protect overlying neurovascular structures, as well as the tendons. The foreign body was in line with the entrance  site which was in the radial aspect of the radial metaphyseal region. The foreign body was identified and noted to be deep to the second and third dorsal compartment tendons. The extensor indices proprius also was identified and protected. The foreign body was followed distally and noted to engage the third [facility] joint. The foreign body was a tubular structure approximately 7-8 mm in diameter and made of a black composite type material that splintered quite easily. All of the major fragments were removed as well as most of the small splintered pieces. This required meticulous dissection.   After thorough debridement, the wound was copiously irrigated with bacitracin saline solution using bulb irrigation. The retinaculum was reapproximated using 2-0 Vicryl interrupted sutures before the skin was closed using 2-0 Vicryl subcuticular sutures. Benzoin and Steri-Strips were applied to the skin. The sutures that had been placed into the entrance site along the radial metaphysis by the ER physician were removed. The skin margins were debrided sharply and the wound reapproximated using 4-0 Prolene interrupted sutures. A total of 10 cc of 0.5% plain Sensorcaine was injected in and around the 2 incision sites before a sterile bulky dressing was applied to the wound. The placed was placed into a volar wrist splint maintaining the wrist in neutral position before the patient was awakened, extubated, and returned to the recovery room in satisfactory condition after tolerating the procedure well.

## 2015-07-27 NOTE — Progress Notes (Signed)
Unable to clip arm for surgery due to pain in arm.

## 2015-07-27 NOTE — Anesthesia Preprocedure Evaluation (Addendum)
Anesthesia Evaluation  Patient identified by MRN, date of birth, ID band Patient awake    Reviewed: Allergy & Precautions, NPO status , Patient's Chart, lab work & pertinent test results  Airway Mallampati: II  TM Distance: >3 FB     Dental  (+) Chipped   Pulmonary former smoker,    Pulmonary exam normal        Cardiovascular hypertension, Pt. on medications Normal cardiovascular exam     Neuro/Psych negative neurological ROS  negative psych ROS   GI/Hepatic Neg liver ROS, GERD  Medicated and Controlled,  Endo/Other  negative endocrine ROS  Renal/GU negative Renal ROS  negative genitourinary   Musculoskeletal Foreign body in hand   Abdominal Normal abdominal exam  (+)   Peds negative pediatric ROS (+)  Hematology negative hematology ROS (+)   Anesthesia Other Findings   Reproductive/Obstetrics                            Anesthesia Physical Anesthesia Plan  ASA: II  Anesthesia Plan: General   Post-op Pain Management:    Induction: Intravenous and Rapid sequence  Airway Management Planned: Oral ETT  Additional Equipment:   Intra-op Plan:   Post-operative Plan: Extubation in OR  Informed Consent: I have reviewed the patients History and Physical, chart, labs and discussed the procedure including the risks, benefits and alternatives for the proposed anesthesia with the patient or authorized representative who has indicated his/her understanding and acceptance.     Plan Discussed with: CRNA and Surgeon  Anesthesia Plan Comments: (Patient agrees with light GOT with use of local to minimize the general effects.)       Anesthesia Quick Evaluation

## 2015-07-27 NOTE — H&P (Signed)
Paper H&P to be scanned into permanent record. H&P reviewed. No changes. 

## 2015-07-27 NOTE — Progress Notes (Signed)
Cardiopulmonary here for EKG

## 2015-07-27 NOTE — Anesthesia Postprocedure Evaluation (Signed)
Anesthesia Post Note  Patient: Clifford Mccarthy  Procedure(s) Performed: Procedure(s) (LRB): REMOVAL FOREIGN BODY EXTREMITY (Left)  Patient location during evaluation: PACU Anesthesia Type: General Level of consciousness: awake Pain management: pain level controlled Vital Signs Assessment: post-procedure vital signs reviewed and stable Respiratory status: spontaneous breathing Cardiovascular status: blood pressure returned to baseline Anesthetic complications: no    Last Vitals:  Filed Vitals:   07/27/15 1605 07/27/15 1620  BP: 143/98 129/97  Pulse: 102 72  Temp: 36.8 C   Resp: 18 18    Last Pain:  Filed Vitals:   07/27/15 1622  PainSc: 0-No pain                 VAN STAVEREN,[name]

## 2015-07-27 NOTE — Anesthesia Procedure Notes (Signed)
Procedure Name: Intubation Date/Time: 07/27/2015 2:27 PM Performed by: Shirlee LimerickMARION, Erich Kochan Pre-anesthesia Checklist: Patient identified, Emergency Drugs available, Suction available and Patient being monitored Patient Re-evaluated:Patient Re-evaluated prior to inductionOxygen Delivery Method: Circle system utilized Preoxygenation: Pre-oxygenation with 100% oxygen Intubation Type: IV induction Laryngoscope Size: Mac and 3 Grade View: Grade I Tube type: Oral Tube size: 7.0 mm Number of attempts: 1 Placement Confirmation: ETT inserted through vocal cords under direct vision,  positive ETCO2 and breath sounds checked- equal and bilateral Secured at: 23 cm Tube secured with: Tape Dental Injury: Teeth and Oropharynx as per pre-operative assessment

## 2015-07-28 ENCOUNTER — Encounter: Payer: Self-pay | Admitting: Surgery

## 2018-01-24 IMAGING — CR DG HAND COMPLETE 3+V*L*
1 series · 3 of 3 positions shown · non-contrast
Comparison: None.

CLINICAL DATA: reports taking down shed and was hit with unknown
object; approx 1" lac noted to hand with no active bleeding. Pain
across metacarpals. Initially counter

EXAM:
LEFT HAND - COMPLETE 3+ VIEW

[Series 1: x hand pa left · 0.14mm/px · 3 of 3 slices shown]
[im 1/3]
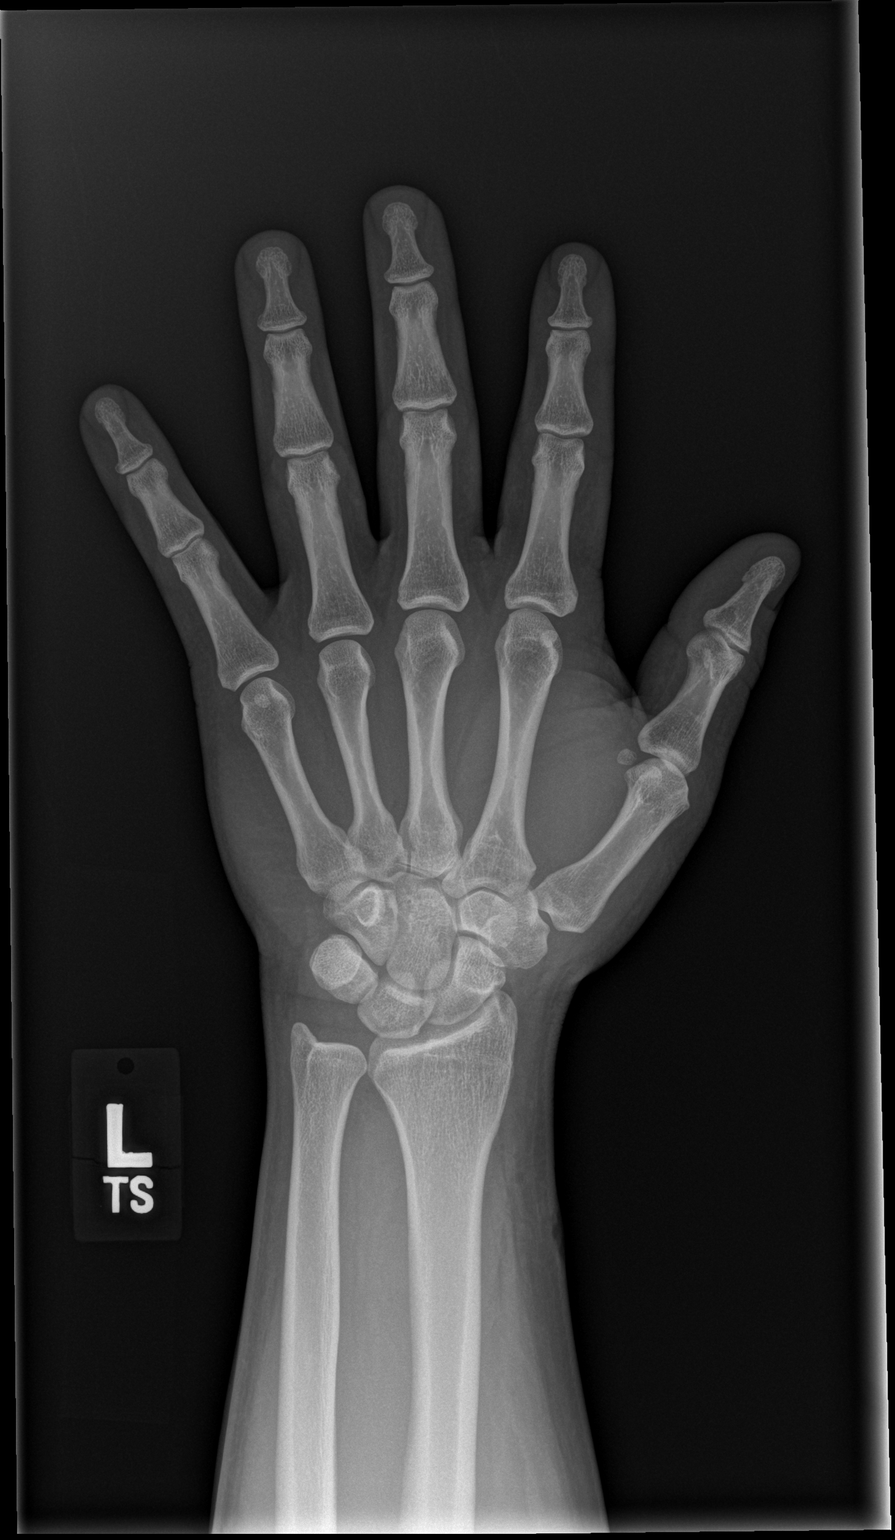
[im 2/3]
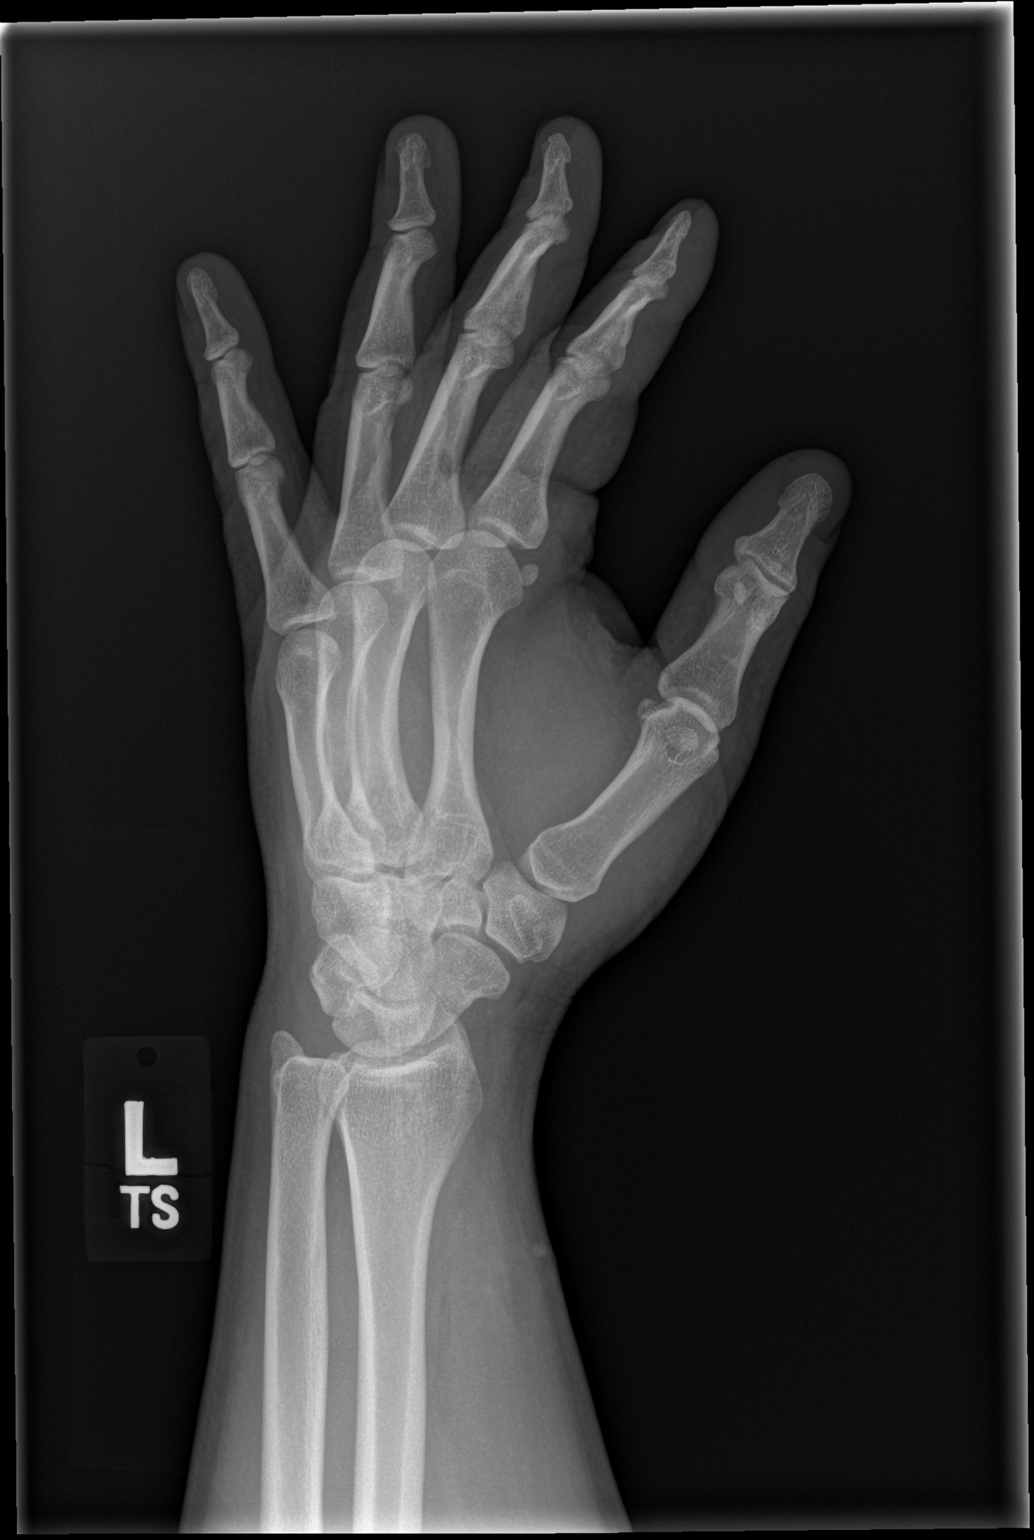
[im 3/3]
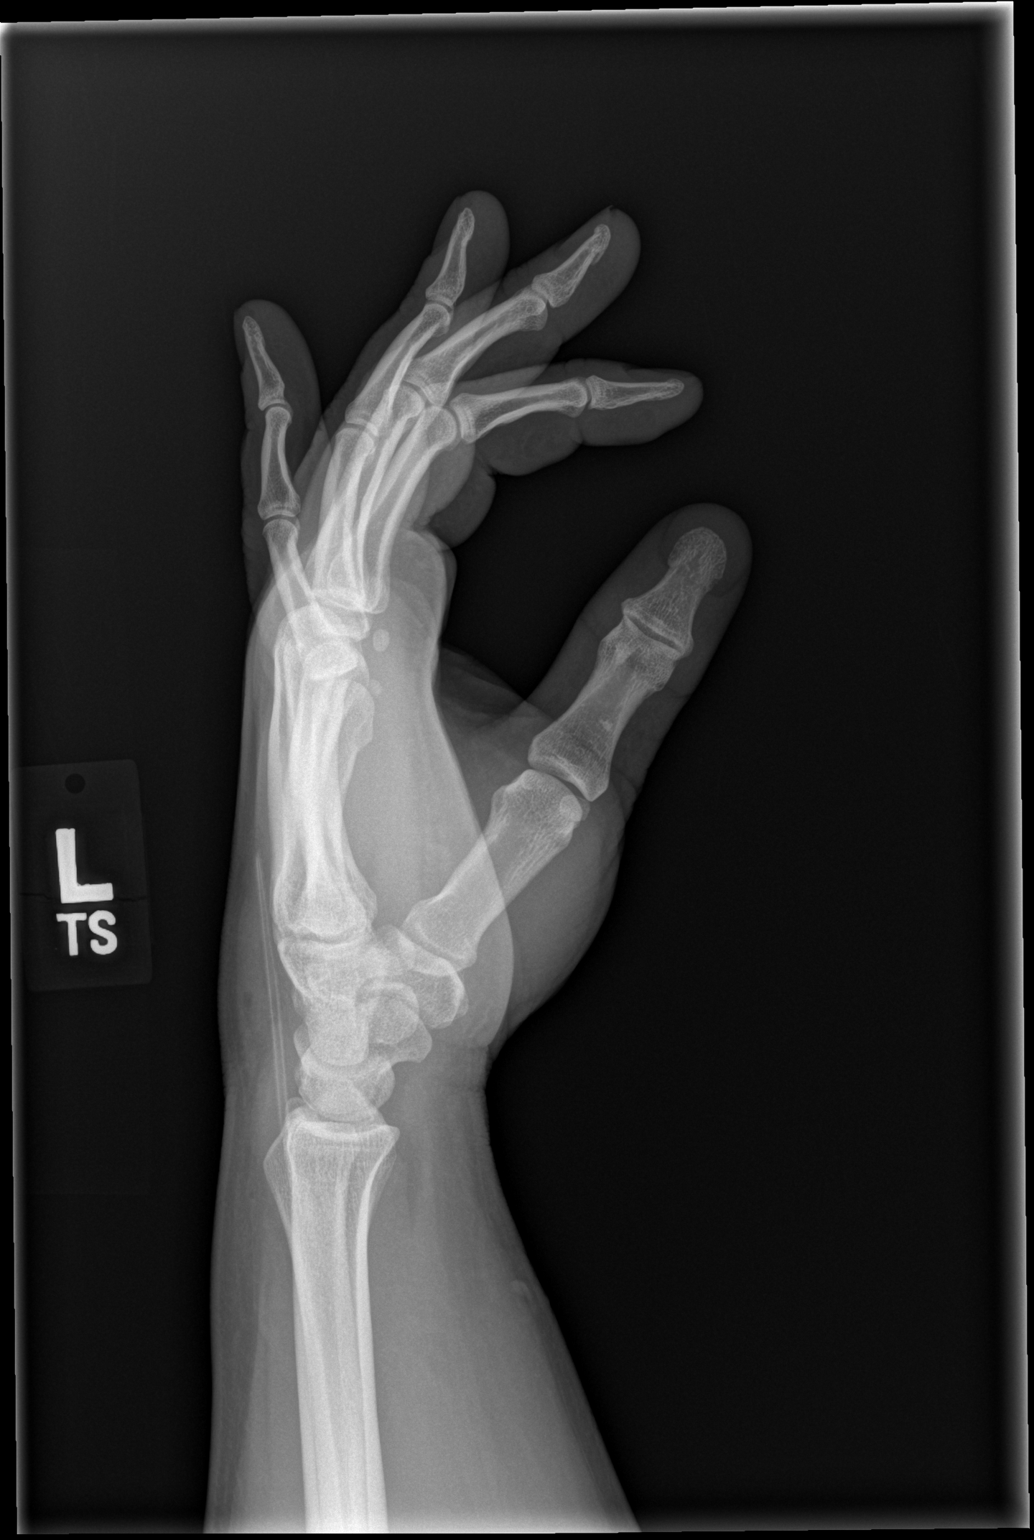

[3 of 3 positions shown; findings below may reference images not displayed]

FINDINGS: No evidence of fracture dislocation.

There is a linear 4 cm foreign body projecting over the radial
aspect of the [REDACTED]. This may represent a foreign body within
the soft tissues. This object is only minimally radiodense along the
edges.
IMPRESSION: Potential long narrow foreign body with within the soft tissues
dorsal carpal row

## 2018-01-24 IMAGING — DX DG HAND COMPLETE 3+V*L*
3 series · 3 of 3 positions shown · non-contrast
Comparison: Radiograph dated 07/19/2015

CLINICAL DATA: 35-year-old male with trauma to the left hand.
Evaluate for foreign object.

EXAM:
LEFT HAND - COMPLETE 3+ VIEW

[hand ap]
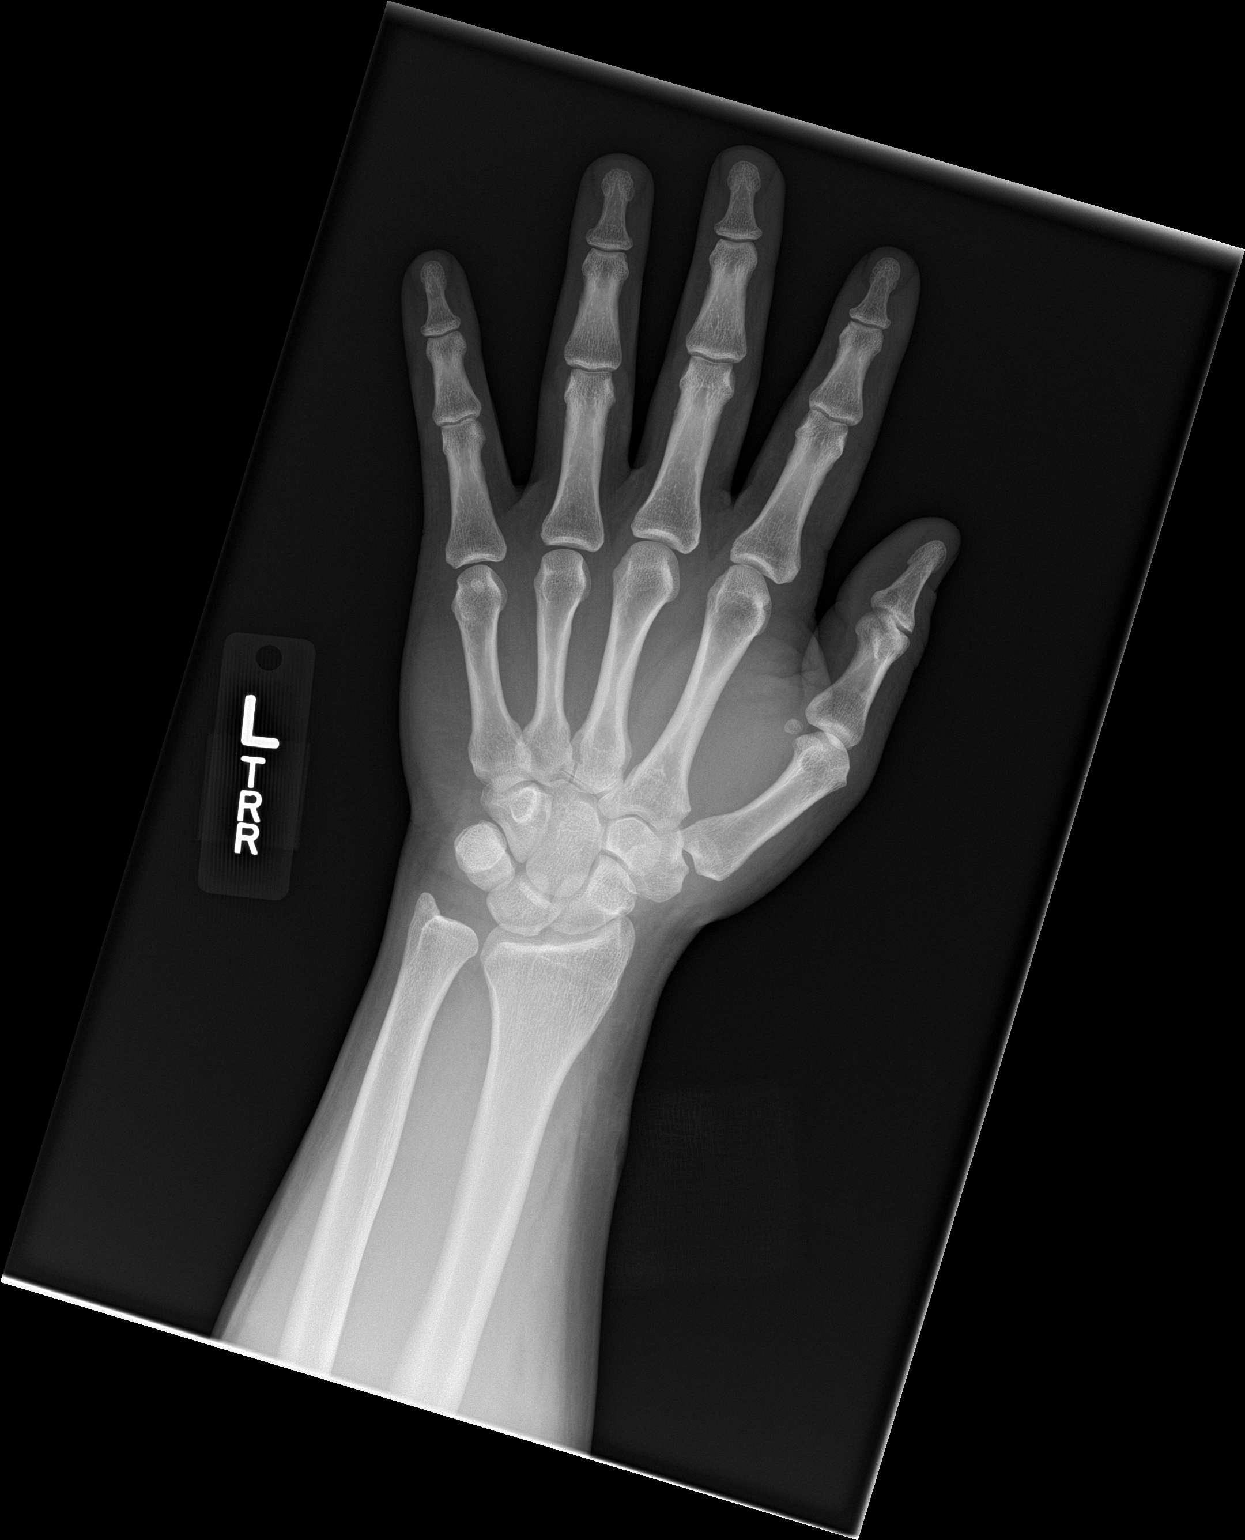

[hand obl]
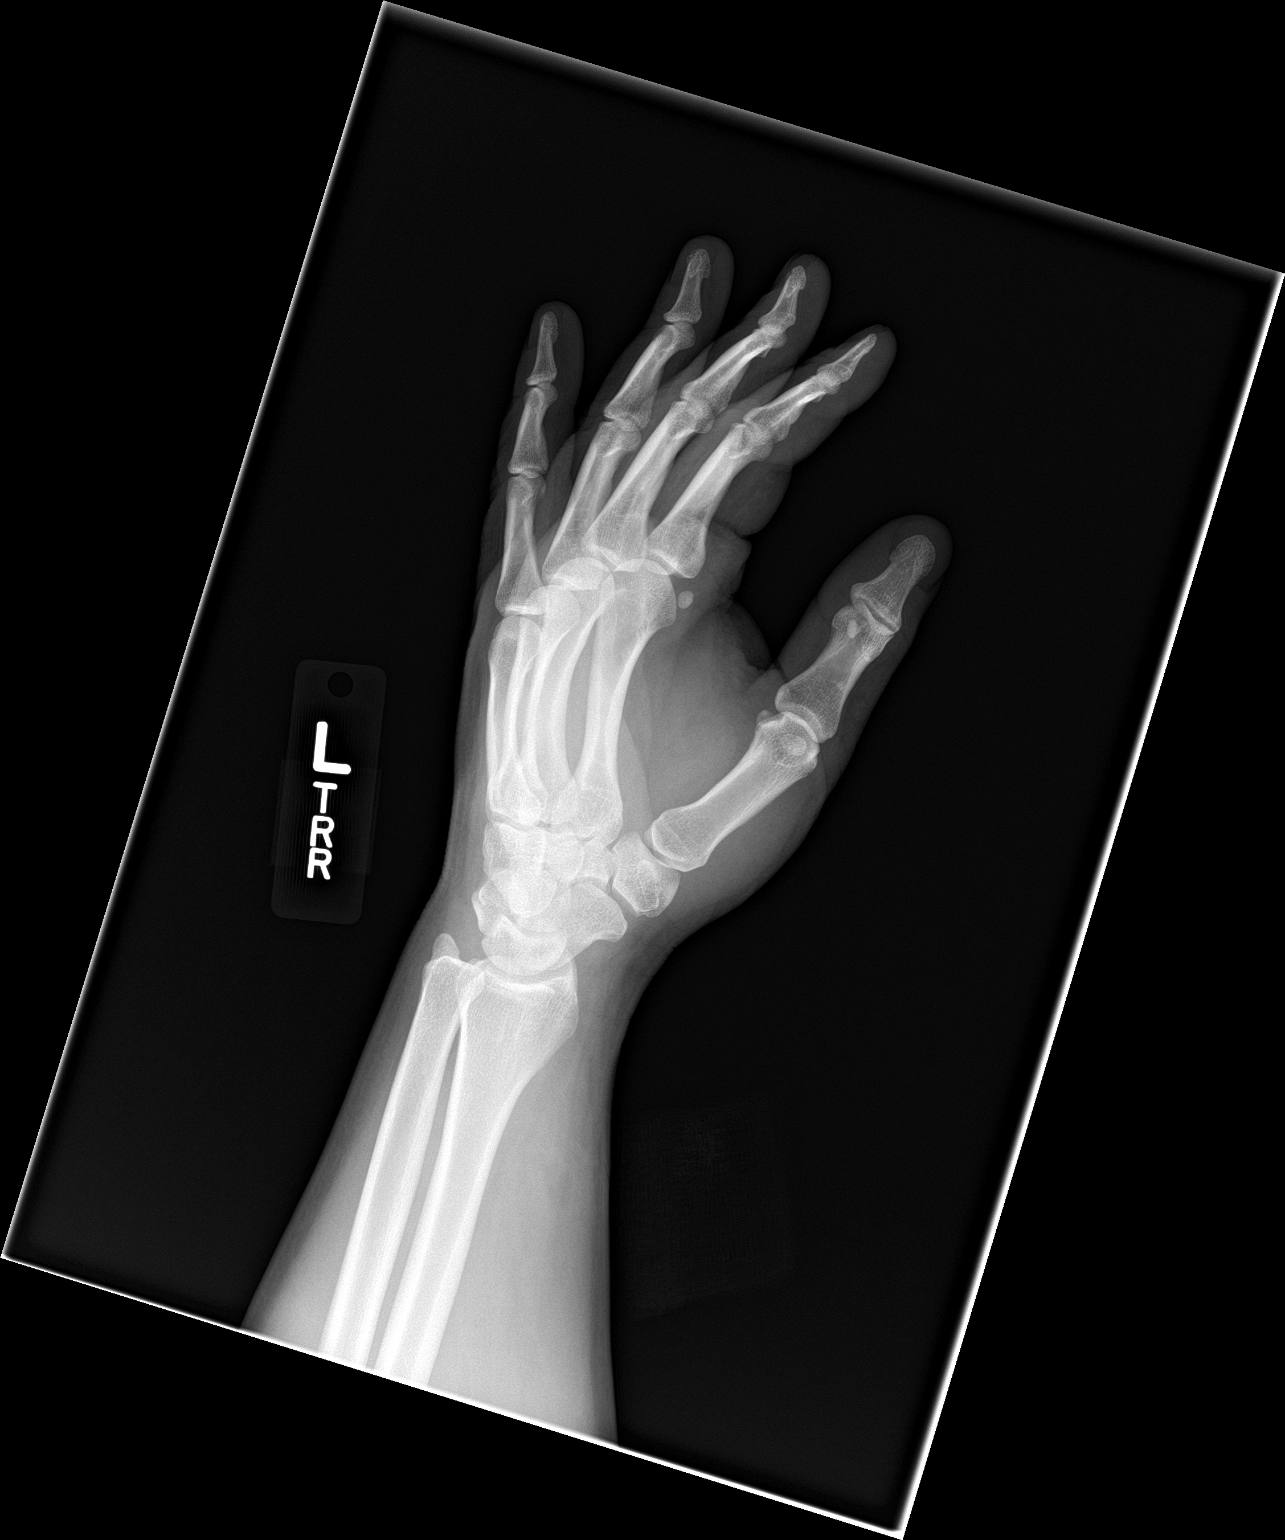

[hand lat]
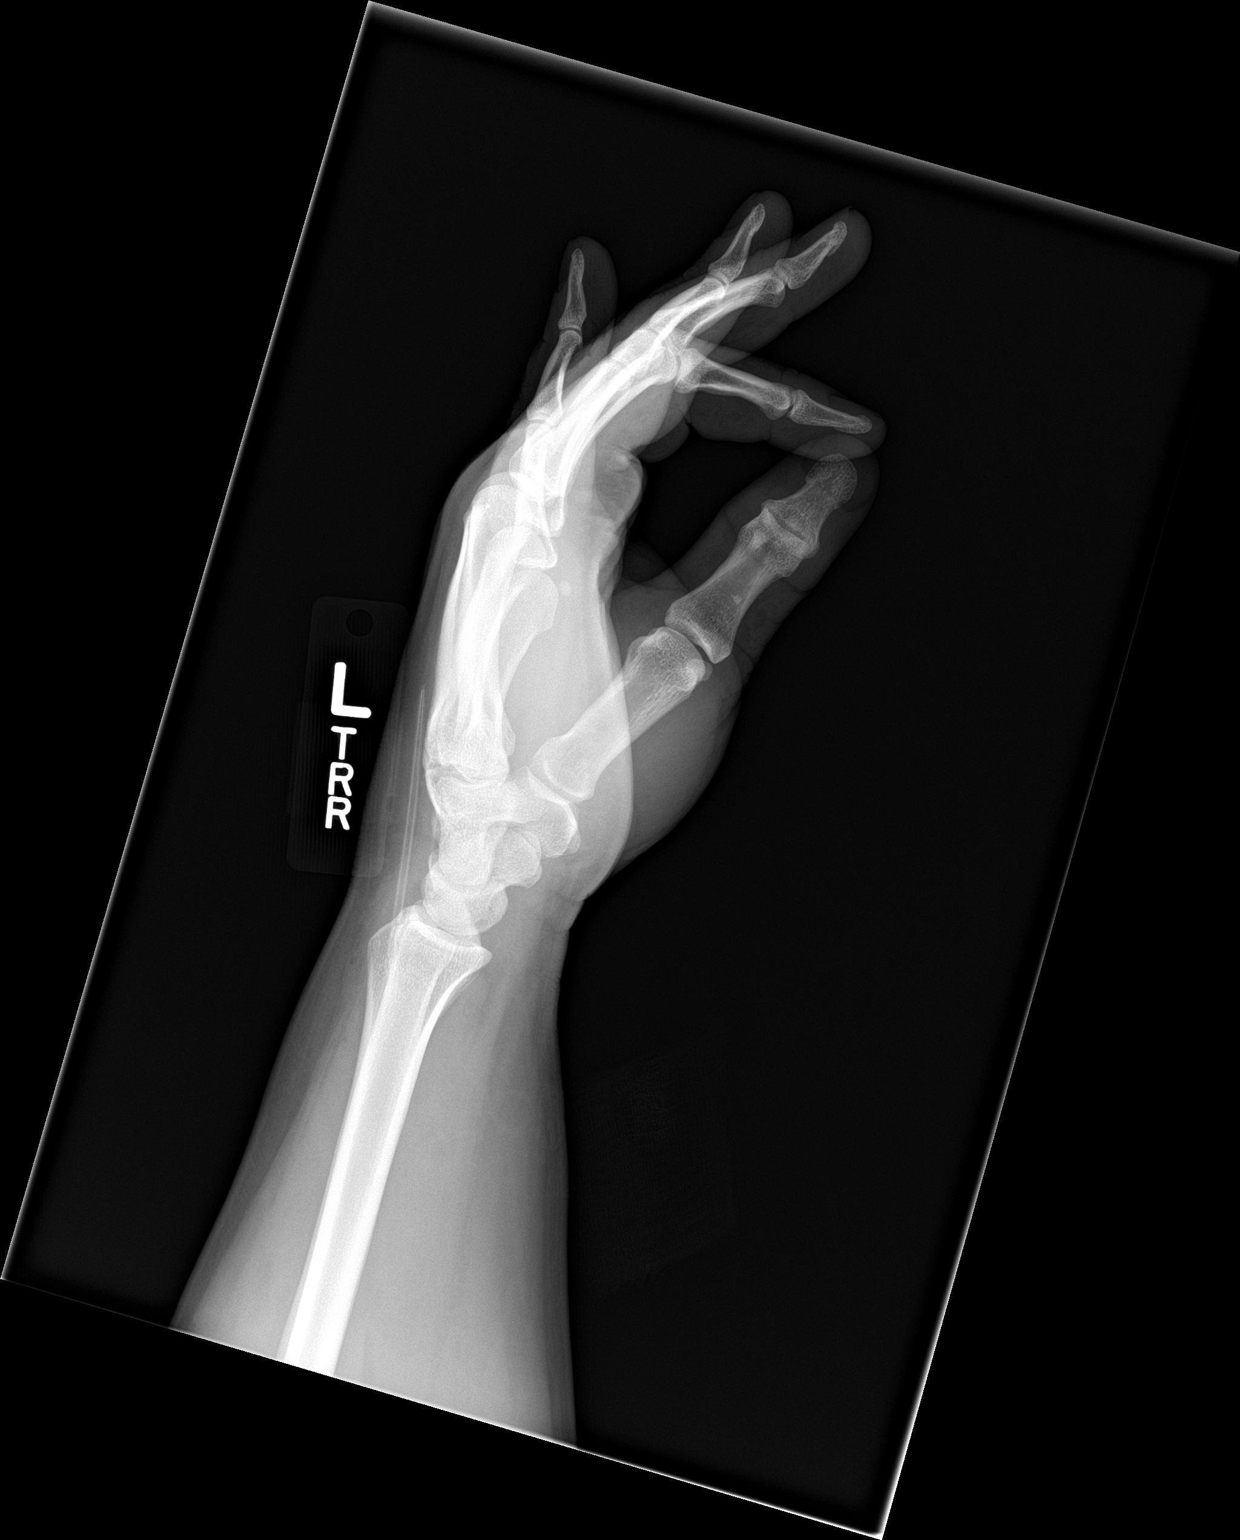

[3 of 3 positions shown; findings below may reference images not displayed]

FINDINGS: There is a 5.5 cm linear foreign object in the soft tissues of the
dorsum of the wrist extending from the distal radius to the proximal
aspect of the third metacarpal similar to prior study. This is
concerning for a foreign object. Clinical correlation is
recommended. There is mild soft tissue swelling of the dorsal aspect
of the distal forearm and wrist. There is no acute fracture or
dislocation. The bones are well mineralized. No arthropathy.
IMPRESSION: Linear radiopaque structure in the dorsal aspect of the wrist as
described similar to prior study may represent a foreign object.
Clinical correlation is recommended.
